# Patient Record
Sex: Male | Born: 1995 | Race: Black or African American | Hispanic: No | Marital: Single | Smoking: Never smoker
Health system: Southern US, Community
[De-identification: ages and names within clinical notes are randomized; demographics above are authoritative.]

---

## 2020-02-03 ENCOUNTER — Ambulatory Visit (HOSPITAL_COMMUNITY): Admission: EM | Admit: 2020-02-03 | Discharge: 2020-02-03 | Discharge status: Against Medical Advice | Payer: Self-pay

## 2020-02-03 ENCOUNTER — Other Ambulatory Visit: Payer: Self-pay

## 2020-07-19 ENCOUNTER — Encounter (HOSPITAL_COMMUNITY): Payer: Self-pay | Admitting: Emergency Medicine

## 2020-07-19 ENCOUNTER — Emergency Department (HOSPITAL_COMMUNITY): Payer: BC Managed Care – PPO

## 2020-07-19 ENCOUNTER — Emergency Department (HOSPITAL_COMMUNITY)
Admission: EM | Admit: 2020-07-19 | Discharge: 2020-07-20 | Disposition: A | Discharge status: Home / Self Care | Payer: BC Managed Care – PPO | Attending: Emergency Medicine | Admitting: Emergency Medicine

## 2020-07-19 ENCOUNTER — Other Ambulatory Visit: Payer: Self-pay

## 2020-07-19 DIAGNOSIS — S80912A Unspecified superficial injury of left knee, initial encounter: Secondary | ICD-10-CM | POA: Diagnosis present

## 2020-07-19 DIAGNOSIS — R52 Pain, unspecified: Secondary | ICD-10-CM

## 2020-07-19 DIAGNOSIS — S86812A Strain of other muscle(s) and tendon(s) at lower leg level, left leg, initial encounter: Secondary | ICD-10-CM | POA: Insufficient documentation

## 2020-07-19 DIAGNOSIS — Y929 Unspecified place or not applicable: Secondary | ICD-10-CM | POA: Diagnosis not present

## 2020-07-19 DIAGNOSIS — W2101XA Struck by football, initial encounter: Secondary | ICD-10-CM | POA: Diagnosis not present

## 2020-07-19 DIAGNOSIS — Y9367 Activity, basketball: Secondary | ICD-10-CM | POA: Insufficient documentation

## 2020-07-19 DIAGNOSIS — Y999 Unspecified external cause status: Secondary | ICD-10-CM | POA: Insufficient documentation

## 2020-07-19 NOTE — ED Triage Notes (Signed)
Pt c/o left knee pain and inability to bear weight after injury while playing basketball today.

## 2020-07-20 MED ORDER — HYDROCODONE-ACETAMINOPHEN 5-325 MG PO TABS
1.0000 | ORAL_TABLET | Freq: Four times a day (QID) | ORAL | 0 refills | Status: DC | PRN
Start: 2020-07-20 — End: 2020-07-31

## 2020-07-20 MED ORDER — HYDROCODONE-ACETAMINOPHEN 5-325 MG PO TABS
2.0000 | ORAL_TABLET | Freq: Once | ORAL | Status: AC
  Administered 2020-07-20: 2 via ORAL
  Filled 2020-07-20: qty 2

## 2020-07-20 MED ORDER — IBUPROFEN 400 MG PO TABS: 400.0000 mg | ORAL_TABLET | Freq: Three times a day (TID) | ORAL | 0 refills | Status: DC | PRN

## 2020-07-20 MED ORDER — IBUPROFEN 400 MG PO TABS
400.0000 mg | ORAL_TABLET | Freq: Once | ORAL | Status: AC
  Administered 2020-07-20: 400 mg via ORAL
  Filled 2020-07-20: qty 1

## 2020-07-20 NOTE — Progress Notes (Signed)
Orthopedic Tech Progress Note Patient Details:  Carlos Harris 1996/09/16 [id number]  Ortho Devices Type of Ortho Device: Arm sling Ortho Device/Splint Location: rue Ortho Device/Splint Interventions: Ordered, Application, Adjustment   Post Interventions Patient Tolerated: Well Instructions Provided: Care of device, Adjustment of device   Trinna Post 07/20/2020, 3:54 AM

## 2020-07-20 NOTE — Discharge Instructions (Addendum)
Keep your leg elevated when you are sitting. You can place ice over your kneecap to help with swelling Please call Dr. Jena Gauss on Tuesday for close follow-up. Do not bear any weight on your left leg

## 2020-07-20 NOTE — ED Provider Notes (Signed)
[facility] EMERGENCY DEPARTMENT Provider Note   CSN: [id number] Arrival date & time: 07/19/20  1758     History Chief Complaint  Patient presents with  . Knee Injury    Carlos Harris is a 24 y.o. male.  The history is provided by the patient.  Knee Pain Location:  Knee Time since incident:  11 hours Injury: yes   Knee location:  L knee Pain details:    Quality:  Aching   Radiates to:  Does not radiate   Severity:  Severe   Onset quality:  Sudden   Timing:  Constant   Progression:  Worsening Chronicity:  New Relieved by:  Nothing Worsened by:  Bearing weight, extension and flexion Associated symptoms: no back pain, no fever and no neck pain   Patient reports that he has injured his left knee. Patient was playing basketball and going up for a lay up and when he landed he felt that he injured his left knee. He does not think he hit the floor with his knee. No other injuries. No head injuries.     PMH-none Social History   Tobacco Use  . Smoking status: Not on file  Substance Use Topics  . Alcohol use: Yes  . Drug use: Not on file    Home Medications Prior to Admission medications   Medication Sig Start Date End Date Taking? Authorizing Provider  HYDROcodone-acetaminophen (NORCO/VICODIN) 5-325 MG tablet Take 1 tablet by mouth every 6 (six) hours as needed for severe pain. 07/20/20   Zadie Rhine, MD  ibuprofen (ADVIL) 400 MG tablet Take 1 tablet (400 mg total) by mouth every 8 (eight) hours as needed. 07/20/20   Zadie Rhine, MD    Allergies    Patient has no known allergies.  Review of Systems   Review of Systems  Constitutional: Negative for fever.  Musculoskeletal: Positive for arthralgias and joint swelling. Negative for back pain and neck pain.  Neurological: Negative for headaches.  All other systems reviewed and are negative.   Physical Exam Updated Vital Signs BP (!) 161/91   Pulse 96   Temp 98.4 F (36.9 C) (Oral)    Resp 18   SpO2 100%   Physical Exam CONSTITUTIONAL: Well developed/well nourished HEAD: Normocephalic/atraumatic EYES: EOMI/PERRL ENMT: Mask in place NECK: supple no meningeal signs SPINE/BACK:entire spine nontender CV: S1/S2 noted, no murmurs/rubs/gallops noted LUNGS: Lungs are clear to auscultation bilaterally, no apparent distress ABDOMEN: soft, nontender NEURO: Pt is awake/alert/appropriate, moves all extremitiesx4.  No facial droop.   EXTREMITIES: pulses normal/equal, distal pulses equal and intact Soft tissue swelling noted over the left patella. Left patella is more superior than the right patella. There is significant tenderness to the left patella. He is unable to flex the knee. There is no left thigh or left ankle tenderness All other extremities/joints palpated/ranged and nontender SKIN: warm, color normal PSYCH: no abnormalities of mood noted, alert and oriented to situation  ED Results / Procedures / Treatments   Labs (all labs ordered are listed, but only abnormal results are displayed) Labs Reviewed - No data to display  EKG None  Radiology DG Knee 1-2 Views Left  Result Date: 07/19/2020 CLINICAL DATA:  Injury today.  Pain. EXAM: LEFT KNEE - 1-2 VIEW COMPARISON:  None. FINDINGS: No acute fracture or dislocation. the patella is high riding, suggesting patellar tendon disruption. No knee joint effusion. IMPRESSION: High riding patella suggests patellar tendon disruption. This could alternatively be positional as the technologist states the patient  is unable to be placed in the in the routine position. Electronically Signed   By: Jeronimo Greaves M.D.   On: 07/19/2020 18:39    Procedures .Ortho Injury Treatment  Date/Time: 07/20/2020 3:25 AM Performed by: Zadie Rhine, MD Authorized by: Zadie Rhine, MD   Consent:    Consent obtained:  Verbal   Consent given by:  Patient   Alternatives discussed:  No treatmentInjury location: knee Location details: left  knee Injury type: soft tissue Pre-procedure neurovascular assessment: neurovascularly intact Pre-procedure distal perfusion: normal Pre-procedure neurological function: normal Immobilization: crutches (Knee immobilizer) Post-procedure neurovascular assessment: post-procedure neurovascularly intact Post-procedure distal perfusion: normal Post-procedure neurological function: normal Patient tolerance: patient tolerated the procedure well with no immediate complications     Medications Ordered in ED Medications  ibuprofen (ADVIL) tablet 400 mg (400 mg Oral Given 07/20/20 0248)  HYDROcodone-acetaminophen (NORCO/VICODIN) 5-325 MG per tablet 2 tablet (2 tablets Oral Given 07/20/20 0247)    ED Course  I have reviewed the triage vital signs and the nursing notes.  Pertinent imaging results that were available during my care of the patient were reviewed by me and considered in my medical decision making (see chart for details).    MDM Rules/Calculators/A&P                          Patient presents after injuring his left knee while playing basketball. Patient has obvious left patellar tendon rupture. There is no bony fracture Patient has been placed in a knee immobilizer with crutches. He will call orthopedics tomorrow. He may require surgical repair.  Final Clinical Impression(s) / ED Diagnoses Final diagnoses:  Patellar tendon rupture, left, initial encounter    Rx / DC Orders ED Discharge Orders         Ordered    HYDROcodone-acetaminophen (NORCO/VICODIN) 5-325 MG tablet  Every 6 hours PRN        07/20/20 0329    ibuprofen (ADVIL) 400 MG tablet  Every 8 hours PRN        07/20/20 0329           Zadie Rhine, MD 07/20/20 [phone]

## 2020-07-20 NOTE — ED Notes (Signed)
Ortho has been paged and will arrive soon for orders

## 2020-07-28 ENCOUNTER — Other Ambulatory Visit (HOSPITAL_COMMUNITY)
Admission: RE | Admit: 2020-07-28 | Discharge: 2020-07-28 | Disposition: A | Discharge status: Home / Self Care | Payer: BC Managed Care – PPO | Source: Ambulatory Visit | Attending: Orthopaedic Surgery | Admitting: Orthopaedic Surgery

## 2020-07-28 DIAGNOSIS — Z20822 Contact with and (suspected) exposure to covid-19: Secondary | ICD-10-CM | POA: Diagnosis not present

## 2020-07-28 DIAGNOSIS — Z01812 Encounter for preprocedural laboratory examination: Secondary | ICD-10-CM | POA: Insufficient documentation

## 2020-07-28 LAB — SARS CORONAVIRUS 2 (TAT 6-24 HRS): SARS Coronavirus 2: NEGATIVE

## 2020-07-29 ENCOUNTER — Encounter (HOSPITAL_BASED_OUTPATIENT_CLINIC_OR_DEPARTMENT_OTHER): Payer: Self-pay | Admitting: Orthopaedic Surgery

## 2020-07-29 ENCOUNTER — Other Ambulatory Visit: Payer: Self-pay

## 2020-07-30 NOTE — H&P (Signed)
PREOPERATIVE H&P  Chief Complaint: SPONTANEOUS RUPTURE OF ETENSOR TENDON LEFT LOWER LEG  HPI: Carlos Harris is a 24 y.o. male who is scheduled for LEFT PATELLA TENDON REPAIR.   Patient is a healthy 24 year old who was playing basketball and going up for a lay up and when he landed he felt that he injured his left knee. He went to [facility] Emergency Department on 07/19/2020. He had an obvious patellar tendon rupture. He was placed in knee immobilizer and told to follow-up with orthopedics for surgical discussion.   His symptoms are rated as moderate to severe, and have been worsening.  This is significantly impairing activities of daily living.    Please see clinic note for further details on this patient's care.    He has elected for surgical management.   History reviewed. No pertinent past medical history. History reviewed. No pertinent surgical history. Social History   Socioeconomic History  . Marital status: Single    Spouse name: Not on file  . Number of children: Not on file  . Years of education: Not on file  . Highest education level: Not on file  Occupational History  . Not on file  Tobacco Use  . Smoking status: Never Smoker  . Smokeless tobacco: Never Used  Substance and Sexual Activity  . Alcohol use: Yes    Comment: once a month  . Drug use: Never  . Sexual activity: Not on file  Other Topics Concern  . Not on file  Social History Narrative  . Not on file   Social Determinants of Health   Financial Resource Strain:   . Difficulty of Paying Living Expenses: Not on file  Food Insecurity:   . Worried About Programme researcher, broadcasting/film/video in the Last Year: Not on file  . Ran Out of Food in the Last Year: Not on file  Transportation Needs:   . Lack of Transportation (Medical): Not on file  . Lack of Transportation (Non-Medical): Not on file  Physical Activity:   . Days of Exercise per Week: Not on file  . Minutes of Exercise per Session: Not on file  Stress:    . Feeling of Stress : Not on file  Social Connections:   . Frequency of Communication with Friends and Family: Not on file  . Frequency of Social Gatherings with Friends and Family: Not on file  . Attends Religious Services: Not on file  . Active Member of Clubs or Organizations: Not on file  . Attends Banker Meetings: Not on file  . Marital Status: Not on file   History reviewed. No pertinent family history. No Known Allergies Prior to Admission medications   Medication Sig Start Date End Date Taking? Authorizing Provider  HYDROcodone-acetaminophen (NORCO/VICODIN) 5-325 MG tablet Take 1 tablet by mouth every 6 (six) hours as needed for severe pain. 07/20/20   Zadie Rhine, MD  ibuprofen (ADVIL) 400 MG tablet Take 1 tablet (400 mg total) by mouth every 8 (eight) hours as needed. 07/20/20   Zadie Rhine, MD    ROS: All other systems have been reviewed and were otherwise negative with the exception of those mentioned in the HPI and as above.  Physical Exam: General: Alert, no acute distress Cardiovascular: No pedal edema Respiratory: No cyanosis, no use of accessory musculature GI: No organomegaly, abdomen is soft and non-tender Skin: No lesions in the area of chief complaint Neurologic: Sensation intact distally Psychiatric: Patient is competent for consent with normal  mood and affect Lymphatic: No axillary or cervical lymphadenopathy  MUSCULOSKELETAL:  Left knee: high patella compared to contralateral side. Tender to palpation left patella. Palpable deformity. Effusion. Limited ROM due to pain.   Imaging: MRI of left knee demonstrates complete tear of the mid patellar tendon and intact ACL.   Assessment: SPONTANEOUS RUPTURE OF ETENSOR TENDON LEFT LOWER LEG  Plan: Plan for Procedure(s): LEFT PATELLA TENDON REPAIR  The risks benefits and alternatives were discussed with the patient including but not limited to the risks of nonoperative treatment, versus  surgical intervention including infection, bleeding, nerve injury,  blood clots, cardiopulmonary complications, morbidity, mortality, among others, and they were willing to proceed.   The patient acknowledged the explanation, agreed to proceed with the plan and consent was signed.   Operative Plan: Left patella tendon repair Discharge Medications: Standard DVT Prophylaxis: Aspirin Physical Therapy: Outpatient PT Special Discharge needs: Knee immobilizer   Vernetta Honey, PA-C  07/30/2020 2:54 PM

## 2020-07-31 ENCOUNTER — Other Ambulatory Visit: Payer: Self-pay

## 2020-07-31 ENCOUNTER — Encounter (HOSPITAL_BASED_OUTPATIENT_CLINIC_OR_DEPARTMENT_OTHER)
Admission: RE | Disposition: A | Discharge status: Home / Self Care | Payer: Self-pay | Source: Home / Self Care | Attending: Orthopaedic Surgery

## 2020-07-31 ENCOUNTER — Ambulatory Visit (HOSPITAL_BASED_OUTPATIENT_CLINIC_OR_DEPARTMENT_OTHER): Payer: BC Managed Care – PPO | Admitting: Certified Registered"

## 2020-07-31 ENCOUNTER — Ambulatory Visit (HOSPITAL_BASED_OUTPATIENT_CLINIC_OR_DEPARTMENT_OTHER)
Admission: RE | Admit: 2020-07-31 | Discharge: 2020-07-31 | Disposition: A | Discharge status: Home / Self Care | Payer: BC Managed Care – PPO | Attending: Orthopaedic Surgery | Admitting: Orthopaedic Surgery

## 2020-07-31 ENCOUNTER — Encounter (HOSPITAL_BASED_OUTPATIENT_CLINIC_OR_DEPARTMENT_OTHER): Payer: Self-pay | Admitting: Orthopaedic Surgery

## 2020-07-31 DIAGNOSIS — S76112A Strain of left quadriceps muscle, fascia and tendon, initial encounter: Secondary | ICD-10-CM | POA: Diagnosis not present

## 2020-07-31 DIAGNOSIS — X500XXA Overexertion from strenuous movement or load, initial encounter: Secondary | ICD-10-CM | POA: Insufficient documentation

## 2020-07-31 HISTORY — PX: PATELLAR TENDON REPAIR: SHX737

## 2020-07-31 SURGERY — REPAIR, TENDON, PATELLAR
Anesthesia: Regional | Site: Knee | Laterality: Left

## 2020-07-31 MED ORDER — VANCOMYCIN HCL 1000 MG IV SOLR
INTRAVENOUS | Status: AC
  Filled 2020-07-31: qty 1000

## 2020-07-31 MED ORDER — ASPIRIN 81 MG PO CHEW: 81.0000 mg | CHEWABLE_TABLET | Freq: Two times a day (BID) | ORAL | 0 refills | Status: AC

## 2020-07-31 MED ORDER — LIDOCAINE 2% (20 MG/ML) 5 ML SYRINGE
INTRAMUSCULAR | Status: AC
  Filled 2020-07-31: qty 5

## 2020-07-31 MED ORDER — ACETAMINOPHEN 160 MG/5ML PO SOLN: 325.0000 mg | ORAL | Status: DC | PRN

## 2020-07-31 MED ORDER — GLYCOPYRROLATE PF 0.2 MG/ML IJ SOSY
PREFILLED_SYRINGE | INTRAMUSCULAR | Status: AC
  Filled 2020-07-31: qty 2

## 2020-07-31 MED ORDER — OXYCODONE HCL 5 MG PO TABS: ORAL_TABLET | ORAL | 0 refills | Status: AC

## 2020-07-31 MED ORDER — OXYCODONE HCL 5 MG PO TABS
ORAL_TABLET | ORAL | Status: AC
  Filled 2020-07-31: qty 1

## 2020-07-31 MED ORDER — DEXAMETHASONE SODIUM PHOSPHATE 10 MG/ML IJ SOLN
INTRAMUSCULAR | Status: AC
  Filled 2020-07-31: qty 1

## 2020-07-31 MED ORDER — VANCOMYCIN HCL 1 G IV SOLR
INTRAVENOUS | Status: DC | PRN
  Administered 2020-07-31: 1000 mg

## 2020-07-31 MED ORDER — ROCURONIUM BROMIDE 10 MG/ML (PF) SYRINGE
PREFILLED_SYRINGE | INTRAVENOUS | Status: AC
  Filled 2020-07-31: qty 20

## 2020-07-31 MED ORDER — CEFAZOLIN SODIUM-DEXTROSE 2-4 GM/100ML-% IV SOLN: 2.0000 g | INTRAVENOUS | Status: DC

## 2020-07-31 MED ORDER — PROPOFOL 10 MG/ML IV BOLUS
INTRAVENOUS | Status: DC | PRN
  Administered 2020-07-31: 180 mg via INTRAVENOUS

## 2020-07-31 MED ORDER — MIDAZOLAM HCL 2 MG/2ML IJ SOLN
INTRAMUSCULAR | Status: AC
  Filled 2020-07-31: qty 2

## 2020-07-31 MED ORDER — FENTANYL CITRATE (PF) 100 MCG/2ML IJ SOLN
INTRAMUSCULAR | Status: AC
  Filled 2020-07-31: qty 2

## 2020-07-31 MED ORDER — DEXAMETHASONE SODIUM PHOSPHATE 10 MG/ML IJ SOLN
INTRAMUSCULAR | Status: DC | PRN
  Administered 2020-07-31: 10 mg via INTRAVENOUS

## 2020-07-31 MED ORDER — CEFAZOLIN SODIUM-DEXTROSE 2-3 GM-%(50ML) IV SOLR
INTRAVENOUS | Status: DC | PRN
  Administered 2020-07-31: 2 g via INTRAVENOUS

## 2020-07-31 MED ORDER — LACTATED RINGERS IV SOLN: INTRAVENOUS | Status: DC

## 2020-07-31 MED ORDER — FENTANYL CITRATE (PF) 100 MCG/2ML IJ SOLN
INTRAMUSCULAR | Status: DC | PRN
Start: 2020-07-31 — End: 2020-07-31
  Administered 2020-07-31: 50 ug via INTRAVENOUS
  Administered 2020-07-31 (×2): 25 ug via INTRAVENOUS

## 2020-07-31 MED ORDER — ONDANSETRON HCL 4 MG/2ML IJ SOLN
INTRAMUSCULAR | Status: DC | PRN
  Administered 2020-07-31: 4 mg via INTRAVENOUS

## 2020-07-31 MED ORDER — FENTANYL CITRATE (PF) 100 MCG/2ML IJ SOLN
50.0000 ug | Freq: Once | INTRAMUSCULAR | Status: AC
  Administered 2020-07-31: 50 ug via INTRAVENOUS

## 2020-07-31 MED ORDER — LACTATED RINGERS IV SOLN: INTRAVENOUS | Status: DC | PRN

## 2020-07-31 MED ORDER — HYDROMORPHONE HCL 1 MG/ML IJ SOLN
INTRAMUSCULAR | Status: AC
  Filled 2020-07-31: qty 0.5

## 2020-07-31 MED ORDER — ONDANSETRON HCL 4 MG PO TABS: 4.0000 mg | ORAL_TABLET | Freq: Three times a day (TID) | ORAL | 1 refills | Status: AC | PRN

## 2020-07-31 MED ORDER — CEFAZOLIN SODIUM-DEXTROSE 2-4 GM/100ML-% IV SOLN
INTRAVENOUS | Status: AC
  Filled 2020-07-31: qty 100

## 2020-07-31 MED ORDER — ACETAMINOPHEN 500 MG PO TABS: 1000.0000 mg | ORAL_TABLET | Freq: Three times a day (TID) | ORAL | 0 refills | Status: AC

## 2020-07-31 MED ORDER — ACETAMINOPHEN 325 MG PO TABS: 325.0000 mg | ORAL_TABLET | ORAL | Status: DC | PRN

## 2020-07-31 MED ORDER — BUPIVACAINE HCL (PF) 0.5 % IJ SOLN
INTRAMUSCULAR | Status: DC | PRN
  Administered 2020-07-31: 30 mL via PERINEURAL

## 2020-07-31 MED ORDER — ONDANSETRON HCL 4 MG/2ML IJ SOLN
INTRAMUSCULAR | Status: AC
  Filled 2020-07-31: qty 2

## 2020-07-31 MED ORDER — ONDANSETRON HCL 4 MG/2ML IJ SOLN: 4.0000 mg | Freq: Once | INTRAMUSCULAR | Status: DC | PRN

## 2020-07-31 MED ORDER — PROPOFOL 10 MG/ML IV BOLUS
INTRAVENOUS | Status: AC
  Filled 2020-07-31: qty 20

## 2020-07-31 MED ORDER — MIDAZOLAM HCL 2 MG/2ML IJ SOLN
2.0000 mg | Freq: Once | INTRAMUSCULAR | Status: AC
  Administered 2020-07-31: 2 mg via INTRAVENOUS

## 2020-07-31 MED ORDER — BUPIVACAINE HCL (PF) 0.5 % IJ SOLN
INTRAMUSCULAR | Status: AC
  Filled 2020-07-31: qty 30

## 2020-07-31 MED ORDER — OXYCODONE HCL 5 MG PO TABS
5.0000 mg | ORAL_TABLET | Freq: Once | ORAL | Status: AC | PRN
  Administered 2020-07-31: 5 mg via ORAL

## 2020-07-31 MED ORDER — MELOXICAM 7.5 MG PO TABS: 7.5000 mg | ORAL_TABLET | Freq: Every day | ORAL | 0 refills | Status: AC

## 2020-07-31 MED ORDER — OXYCODONE HCL 5 MG/5ML PO SOLN: 5.0000 mg | Freq: Once | ORAL | Status: AC | PRN

## 2020-07-31 MED ORDER — HYDROMORPHONE HCL 1 MG/ML IJ SOLN
0.2500 mg | INTRAMUSCULAR | Status: DC | PRN
  Administered 2020-07-31 (×2): 0.5 mg via INTRAVENOUS

## 2020-07-31 MED ORDER — MIDAZOLAM HCL 2 MG/2ML IJ SOLN
INTRAMUSCULAR | Status: DC | PRN
  Administered 2020-07-31: 2 mg via INTRAVENOUS

## 2020-07-31 SURGICAL SUPPLY — 86 items
ANCH SUT SWLK 19.1X4.75 (Anchor) ×4 IMPLANT
ANCHOR SUT BIO SW 4.75X19.1 (Anchor) ×12 IMPLANT
APL PRP STRL LF DISP 70% ISPRP (MISCELLANEOUS) ×1
APL SKNCLS STERI-STRIP NONHPOA (GAUZE/BANDAGES/DRESSINGS) ×1
BANDAGE ESMARK 6X9 LF (GAUZE/BANDAGES/DRESSINGS) ×1 IMPLANT
BENZOIN TINCTURE PRP APPL 2/3 (GAUZE/BANDAGES/DRESSINGS) ×3 IMPLANT
BLADE SURG 10 STRL SS (BLADE) ×3 IMPLANT
BLADE SURG 15 STRL LF DISP TIS (BLADE) ×1 IMPLANT
BLADE SURG 15 STRL SS (BLADE) ×3
BNDG CMPR 9X6 STRL LF SNTH (GAUZE/BANDAGES/DRESSINGS) ×1
BNDG COHESIVE 4X5 TAN STRL (GAUZE/BANDAGES/DRESSINGS) ×3 IMPLANT
BNDG ELASTIC 4X5.8 VLCR STR LF (GAUZE/BANDAGES/DRESSINGS) ×3 IMPLANT
BNDG ELASTIC 6X5.8 VLCR STR LF (GAUZE/BANDAGES/DRESSINGS) ×3 IMPLANT
BNDG ESMARK 6X9 LF (GAUZE/BANDAGES/DRESSINGS) ×3
CHLORAPREP W/TINT 26 (MISCELLANEOUS) ×3 IMPLANT
CLOSURE STERI-STRIP 1/2X4 (GAUZE/BANDAGES/DRESSINGS) ×1
CLSR STERI-STRIP ANTIMIC 1/2X4 (GAUZE/BANDAGES/DRESSINGS) ×2 IMPLANT
COVER BACK TABLE 60X90IN (DRAPES) IMPLANT
COVER MAYO STAND STRL (DRAPES) ×3 IMPLANT
COVER WAND RF STERILE (DRAPES) IMPLANT
CUFF TOURNIQUET SINGLE 34IN LL (TOURNIQUET CUFF) ×3 IMPLANT
DECANTER SPIKE VIAL GLASS SM (MISCELLANEOUS) IMPLANT
DRAPE C-ARM 42X72 X-RAY (DRAPES) IMPLANT
DRAPE C-ARMOR (DRAPES) IMPLANT
DRAPE EXTREMITY T 121X128X90 (DISPOSABLE) ×3 IMPLANT
DRAPE IMP U-DRAPE 54X76 (DRAPES) ×6 IMPLANT
DRAPE INCISE IOBAN 66X45 STRL (DRAPES) ×3 IMPLANT
DRAPE U-SHAPE 47X51 STRL (DRAPES) ×3 IMPLANT
DRSG AQUACEL AG ADV 3.5X10 (GAUZE/BANDAGES/DRESSINGS) ×3 IMPLANT
ELECT REM PT RETURN 9FT ADLT (ELECTROSURGICAL) ×3
ELECTRODE REM PT RTRN 9FT ADLT (ELECTROSURGICAL) ×1 IMPLANT
GAUZE SPONGE 4X4 12PLY STRL (GAUZE/BANDAGES/DRESSINGS) ×3 IMPLANT
GAUZE XEROFORM 1X8 LF (GAUZE/BANDAGES/DRESSINGS) ×3 IMPLANT
GLOVE BIO SURGEON STRL SZ 6.5 (GLOVE) ×2 IMPLANT
GLOVE BIO SURGEONS STRL SZ 6.5 (GLOVE) ×1
GLOVE BIOGEL PI IND STRL 6.5 (GLOVE) ×1 IMPLANT
GLOVE BIOGEL PI IND STRL 8 (GLOVE) ×1 IMPLANT
GLOVE BIOGEL PI INDICATOR 6.5 (GLOVE) ×2
GLOVE BIOGEL PI INDICATOR 8 (GLOVE) ×2
GLOVE ECLIPSE 8.0 STRL XLNG CF (GLOVE) ×3 IMPLANT
GOWN STRL REUS W/ TWL LRG LVL3 (GOWN DISPOSABLE) ×1 IMPLANT
GOWN STRL REUS W/TWL LRG LVL3 (GOWN DISPOSABLE) ×3
GOWN STRL REUS W/TWL XL LVL3 (GOWN DISPOSABLE) ×3 IMPLANT
IMMOBILIZER KNEE 22 UNIV (SOFTGOODS) IMPLANT
IMMOBILIZER KNEE 24 THIGH 36 (MISCELLANEOUS) IMPLANT
IMMOBILIZER KNEE 24 UNIV (MISCELLANEOUS)
IMPL SYS BIOCOMP ACH SPEED (Anchor) ×1 IMPLANT
IMPLANT SYS BIOCOMP ACH SPEED (Anchor) ×3 IMPLANT
KIT ASCP FXDISP 3X8XBTNDS (KITS) IMPLANT
KIT BIO-TENODESIS 3X8 DISP (KITS)
NDL SUT 6 .5 CRC .975X.05 MAYO (NEEDLE) IMPLANT
NEEDLE MAYO TAPER (NEEDLE)
NEEDLE MAYO TROCAR (NEEDLE) ×3 IMPLANT
NS IRRIG 1000ML POUR BTL (IV SOLUTION) ×3 IMPLANT
PACK ARTHROSCOPY DSU (CUSTOM PROCEDURE TRAY) ×3 IMPLANT
PACK BASIN DAY SURGERY FS (CUSTOM PROCEDURE TRAY) ×3 IMPLANT
PAD CAST 4YDX4 CTTN HI CHSV (CAST SUPPLIES) ×1 IMPLANT
PADDING CAST ABS 6INX4YD NS (CAST SUPPLIES)
PADDING CAST ABS COTTON 6X4 NS (CAST SUPPLIES) IMPLANT
PADDING CAST COTTON 4X4 STRL (CAST SUPPLIES) ×3
PENCIL SMOKE EVACUATOR (MISCELLANEOUS) ×3 IMPLANT
RETRIEVER SUT HEWSON (MISCELLANEOUS) ×3 IMPLANT
SLEEVE SCD COMPRESS KNEE MED (MISCELLANEOUS) IMPLANT
SPONGE LAP 18X18 RF (DISPOSABLE) ×3 IMPLANT
STAPLER VISISTAT 35W (STAPLE) IMPLANT
SUCTION FRAZIER HANDLE 10FR (MISCELLANEOUS) ×2
SUCTION TUBE FRAZIER 10FR DISP (MISCELLANEOUS) ×1 IMPLANT
SUT FIBERWIRE #2 38 REV NDL BL (SUTURE) ×3
SUT FIBERWIRE #5 38 CONV NDL (SUTURE) ×12
SUT MNCRL AB 4-0 PS2 18 (SUTURE) ×3 IMPLANT
SUT VIC AB 0 CT1 18XCR BRD 8 (SUTURE) ×1 IMPLANT
SUT VIC AB 0 CT1 27 (SUTURE) ×6
SUT VIC AB 0 CT1 27XBRD ANBCTR (SUTURE) ×2 IMPLANT
SUT VIC AB 0 CT1 8-18 (SUTURE) ×3
SUT VIC AB 2-0 CT1 27 (SUTURE) ×3
SUT VIC AB 2-0 CT1 TAPERPNT 27 (SUTURE) ×1 IMPLANT
SUT VIC AB 3-0 SH 27 (SUTURE) ×3
SUT VIC AB 3-0 SH 27X BRD (SUTURE) ×1 IMPLANT
SUT VIC AB PLUS 45CM 1-MO-4 (SUTURE) ×6 IMPLANT
SUTURE FIBERWR #5 38 CONV NDL (SUTURE) ×4 IMPLANT
SUTURE FIBERWR#2 38 REV NDL BL (SUTURE) ×1 IMPLANT
SUTURE TAPE 1.3 FIBERLOP 20 ST (SUTURE) IMPLANT
SUTURETAPE 1.3 FIBERLOOP 20 ST (SUTURE)
SYR BULB EAR ULCER 3OZ GRN STR (SYRINGE) ×3 IMPLANT
TOWEL GREEN STERILE FF (TOWEL DISPOSABLE) ×3 IMPLANT
TUBE SUCTION HIGH CAP CLEAR NV (SUCTIONS) ×3 IMPLANT

## 2020-07-31 NOTE — Progress Notes (Signed)
Assisted Dr. Nance Pew with left, ultrasound guided, adductor canal block. Side rails up, monitors on throughout procedure. See vital signs in flow sheet. Tolerated Procedure well.

## 2020-07-31 NOTE — Interval H&P Note (Signed)
History and Physical Interval Note:  07/31/2020 11:55 AM  Carlos Harris  has presented today for surgery, with the diagnosis of SPONTANEOUS RUPTURE OF ETENSOR TENDON LEFT LOWER LEG.  The various methods of treatment have been discussed with the patient and family. After consideration of risks, benefits and other options for treatment, the patient has consented to  Procedure(s): LEFT PATELLA TENDON REPAIR (Left) as a surgical intervention.  The patient's history has been reviewed, patient examined, no change in status, stable for surgery.  I have reviewed the patient's chart and labs.  Questions were answered to the patient's satisfaction.     Bjorn Pippin

## 2020-07-31 NOTE — Anesthesia Procedure Notes (Signed)
Anesthesia Regional Block: Adductor canal block   Pre-Anesthetic Checklist: ,, timeout performed, Correct Patient, Correct Site, Correct Laterality, Correct Procedure, Correct Position, site marked, Risks and benefits discussed,  Surgical consent,  Pre-op evaluation,  At surgeon's request and post-op pain management  Laterality: Left  Prep: Dura Prep       Needles:  Injection technique: Single-shot  Needle Type: Echogenic Stimulator Needle     Needle Length: 4cm  Needle Gauge: 20     Additional Needles:   Procedures:,,,, ultrasound used (permanent image in chart),,,,  Narrative:  Start time: 07/31/2020 11:00 AM End time: 07/31/2020 11:07 AM  Performed by: Personally  Anesthesiologist: Dhani Dannemiller, Nelle Don, DO  Additional Notes: Patient identified. Risks/Benefits/Options discussed with patient including but not limited to bleeding, infection, nerve damage, failed block, incomplete pain control. Patient expressed understanding and wished to proceed. All questions were answered. Sterile technique was used throughout the entire procedure. Please see nursing notes for vital signs. Aspirated in 5cc intervals with injection for negative confirmation. Patient was given instructions on fall risk and not to get out of bed. All questions and concerns addressed with instructions to call with any issues or inadequate analgesia.

## 2020-07-31 NOTE — Anesthesia Procedure Notes (Signed)
Procedure Name: LMA Insertion Performed by: Cillian Gwinner M, CRNA Pre-anesthesia Checklist: Patient identified, Emergency Drugs available, Suction available and Patient being monitored Patient Re-evaluated:Patient Re-evaluated prior to induction Oxygen Delivery Method: Circle system utilized Preoxygenation: Pre-oxygenation with 100% oxygen Induction Type: IV induction Ventilation: Mask ventilation without difficulty LMA: LMA inserted LMA Size: 4.0 Number of attempts: 1 Airway Equipment and Method: Bite block Placement Confirmation: positive ETCO2 Tube secured with: Tape Dental Injury: Teeth and Oropharynx as per pre-operative assessment        

## 2020-07-31 NOTE — Discharge Instructions (Signed)
Oxycodone given at 3:30pm. Next dose of Oxycodone can be given at 9:30pm if needed.    Post Anesthesia Home Care Instructions  Activity: Get plenty of rest for the remainder of the day. A responsible individual must stay with you for 24 hours following the procedure.  For the next 24 hours, DO NOT: -Drive a car -Advertising copywriter -Drink alcoholic beverages -Take any medication unless instructed by your physician -Make any legal decisions or sign important papers.  Meals: Start with liquid foods such as gelatin or soup. Progress to regular foods as tolerated. Avoid greasy, spicy, heavy foods. If nausea and/or vomiting occur, drink only clear liquids until the nausea and/or vomiting subsides. Call your physician if vomiting continues.  Special Instructions/Symptoms: Your throat may feel dry or sore from the anesthesia or the breathing tube placed in your throat during surgery. If this causes discomfort, gargle with warm salt water. The discomfort should disappear within 24 hours.      Regional Anesthesia Blocks  1. Numbness or the inability to move the "blocked" extremity may last from 3-48 hours after placement. The length of time depends on the medication injected and your individual response to the medication. If the numbness is not going away after 48 hours, call your surgeon.  2. The extremity that is blocked will need to be protected until the numbness is gone and the  Strength has returned. Because you cannot feel it, you will need to take extra care to avoid injury. Because it may be weak, you may have difficulty moving it or using it. You may not know what position it is in without looking at it while the block is in effect.  3. For blocks in the legs and feet, returning to weight bearing and walking needs to be done carefully. You will need to wait until the numbness is entirely gone and the strength has returned. You should be able to move your leg and foot normally before you  try and bear weight or walk. You will need someone to be with you when you first try to ensure you do not fall and possibly risk injury.  4. Bruising and tenderness at the needle site are common side effects and will resolve in a few days.  5. Persistent numbness or new problems with movement should be communicated to the surgeon or the [facility] [phone] [facility] [phone]).

## 2020-07-31 NOTE — Anesthesia Preprocedure Evaluation (Addendum)
Anesthesia Evaluation  Patient identified by MRN, date of birth, ID band  Reviewed: Patient's Chart, lab work & pertinent test results  Airway Mallampati: I  TM Distance: >3 FB Neck ROM: Full    Dental  (+) Teeth Intact   Pulmonary neg pulmonary ROS,    Pulmonary exam normal        Cardiovascular negative cardio ROS   Rhythm:Regular Rate:Normal     Neuro/Psych    GI/Hepatic negative GI ROS, Neg liver ROS,   Endo/Other  negative endocrine ROS  Renal/GU negative Renal ROS  negative genitourinary   Musculoskeletal Left patella tendon repair playing basketball   Abdominal Normal abdominal exam  (+)  Abdomen: soft. Bowel sounds: normal.  Peds  Hematology negative hematology ROS (+)   Anesthesia Other Findings   Reproductive/Obstetrics                            Anesthesia Physical Anesthesia Plan  ASA: II  Anesthesia Plan: General and Regional   Post-op Pain Management:  Regional for Post-op pain   Induction: Intravenous  PONV Risk Score and Plan: 2 and Ondansetron and Dexamethasone  Airway Management Planned: Mask and LMA  Additional Equipment: None  Intra-op Plan:   Post-operative Plan: Extubation in OR  Informed Consent: I have reviewed the patients History and Physical, chart, labs and discussed the procedure including the risks, benefits and alternatives for the proposed anesthesia with the patient or authorized representative who has indicated his/her understanding and acceptance.       Plan Discussed with: CRNA and Surgeon  Anesthesia Plan Comments: (Covid-19 Nucleic Acid Test Results Lab Results      Component                Value               Date                      [id number]              NEGATIVE            07/28/2020          )        Anesthesia Quick Evaluation

## 2020-07-31 NOTE — Transfer of Care (Signed)
Immediate Anesthesia Transfer of Care Note  Patient: Carlos Harris  Procedure(s) Performed: LEFT PATELLA TENDON REPAIR (Left Knee)  Patient Location: PACU  Anesthesia Type:General and Regional  Level of Consciousness: awake, alert  and oriented  Airway & Oxygen Therapy: Patient Spontanous Breathing and Patient connected to face mask oxygen  Post-op Assessment: Report given to RN and Post -op Vital signs reviewed and stable  Post vital signs: Reviewed and stable  Last Vitals:  Vitals Value Taken Time  BP    Temp    Pulse    Resp    SpO2      Last Pain:  Vitals:   07/31/20 1046  TempSrc: Oral  PainSc: 0-No pain         Complications: No complications documented.

## 2020-07-31 NOTE — Anesthesia Postprocedure Evaluation (Signed)
Anesthesia Post Note  Patient: Carlos Harris  Procedure(s) Performed: LEFT PATELLA TENDON REPAIR (Left Knee)     Patient location during evaluation: PACU Anesthesia Type: Regional and General Level of consciousness: sedated and patient cooperative Pain management: pain level controlled Vital Signs Assessment: post-procedure vital signs reviewed and stable Respiratory status: spontaneous breathing Cardiovascular status: stable Anesthetic complications: no   No complications documented.  Last Vitals:  Vitals:   07/31/20 1500 07/31/20 1503  BP: (!) 155/96 (!) 148/90  Pulse: 78 71  Resp: 13 12  Temp:    SpO2: 98% 100%    Last Pain:  Vitals:   07/31/20 1445  TempSrc:   PainSc: Asleep                 Lewie Loron

## 2020-07-31 NOTE — Op Note (Signed)
Orthopaedic Surgery Operative Note (CSN: [id number])  Carlos Harris  August 02, 1996 Date of Surgery: 07/31/2020   Diagnoses:  Left knee patellar tendon rupture mid substance  Procedure: Left patellar tendon repair with internal bracing Left medial retinacular closure and repair Left lateral retinacular closure and repair   Operative Finding Successful completion of the planned procedure.  Patient's tear was atypical rupturing leaving two thirds of the tendon on the patella and only a third still attached to the tubercle.  We were worried about an end-to-end repair based on this.  We performed a appropriate repair take care not to over tension and placed a internal brace type configuration using 3 distal swivel locks into proximal in the patella.  We had good range of motion to 45 degrees without issue.  If he ruptures or fails this repair he would need a allograft reconstruction most likely with a hamstring augmentation.  Post-operative plan: The patient will be weightbearing to tolerance with the brace locked in extension with therapy to start 2 weeks after surgery.  The patient will be discharged home.  DVT prophylaxis Aspirin 81 mg twice daily for 6 weeks.   Pain control with PRN pain medication preferring oral medicines.  Follow up plan will be scheduled in approximately 7 days for incision check and XR.  Post-Op Diagnosis: Same Surgeons:Primary: Bjorn Pippin, MD Assistants:Caroline McBane PA-C Location: [facility] OR ROOM 1 Anesthesia: General with regional anesthesia Antibiotics: Ancef 2 g with local vancomycin powder 1 g at the surgical site Tourniquet time:  Total Tourniquet Time Documented: Thigh (Left) - 110 minutes Total: Thigh (Left) - 110 minutes  Estimated Blood Loss: Minimal Complications: None Specimens: None Implants: Implant Name Type Inv. Item Serial No. Manufacturer Lot No. LRB No. Used Action  IMPLANT SYS BIOCOMP [facility] SPEED - [id number] Anchor IMPLANT SYS BIOCOMP [employer]  SPEED  [employer] [id number] Left 1 Implanted  ANCHOR SUT BIO SW 4.75X19.1 - [id number] Anchor ANCHOR SUT BIO SW 4.75X19.1  [employer] [id number] Left 1 Implanted  ANCHOR SUT BIO SW 4.75X19.1 - [id number] Anchor ANCHOR SUT BIO SW 4.75X19.1  Marcie Bal [id number] Left 1 Implanted    Indications for Surgery:   Carlos Harris is a 24 y.o. male with .  Benefits and risks of operative and nonoperative management were discussed prior to surgery with patient/guardian(s) and informed consent form was completed.  Specific risks including infection, need for additional surgery, rerupture, stiffness, need for further surgery amongst others   Procedure:   The patient was identified properly. Informed consent was obtained and the surgical site was marked. The patient was taken up to suite where general anesthesia was induced.  The patient was positioned supine on a regular bed.  The left knee was prepped and draped in the usual sterile fashion.  Timeout was performed before the beginning of the case.  Tourniquet was used for the above duration.  The mid longitudinal approach to the anterior knee went to skin sharp achieving hemostasis progressed.  We raised full-thickness skin flaps and medial lateral planes.  We identified the retinacular tears medial and laterally.  We irrigated copiously clearing seroma.  That point we identified that the tendon was torn mid substance with a third left on the tibia and two thirds on the patella.  We placed locking Krakw #5 stitches both proximally and distally keeping track of the sutures.  At that point we placed 2 bio composite 4.75 mm swivel locks in the patella and passed fiber tapes up through the tendon.  We then were able to perform a end-to-end type repair with our distal sutures into our proximal tendon.  We were then able to get good tension and length relationship.  Once this was complete we were able to take our proximal placed repair sutures as well as her fiber  tape internal brace is in place him into a series of 3 distal swivel locks achieving an overlay.  This also included an internal brace with multiple fiber tapes.  Were careful not to over tension.  We had some difficulty placing her distal swivel locks with the patient's bone was significantly dense but we were able to get good fixation with each after reaming with a 5 mm reamer and tapping.  Once this was placed in the were stable and range of motion 45 degrees.  We irrigated again and placed interrupted #1 Vicryl sutures to close and repair the lateral and medial retinaculum.  2 separate parts the procedure.  Spent extensive amount of time closing this area.  We over stitched the repair with Vicryl as well trying to bury the nonabsorbable sutures.    We irrigated the wound copiously before placing local antibiotic as listed above.  We closed the incision in a multilayer fashion with absorbable suture.  Sterile dressing was placed.  Patient was awoken taken to PACU in stable condition.  Alfonse Alpers, PA-C, present and scrubbed throughout the case, critical for completion in a timely fashion, and for retraction, instrumentation, closure.

## 2020-08-04 ENCOUNTER — Encounter (HOSPITAL_BASED_OUTPATIENT_CLINIC_OR_DEPARTMENT_OTHER): Payer: Self-pay | Admitting: Orthopaedic Surgery

## 2021-10-15 IMAGING — DX DG KNEE 1-2V*L*
2 series · 2 of 2 positions shown · non-contrast
Comparison: None.

CLINICAL DATA: Injury today.  Pain.

EXAM:
LEFT KNEE - 1-2 VIEW

[knee ap]
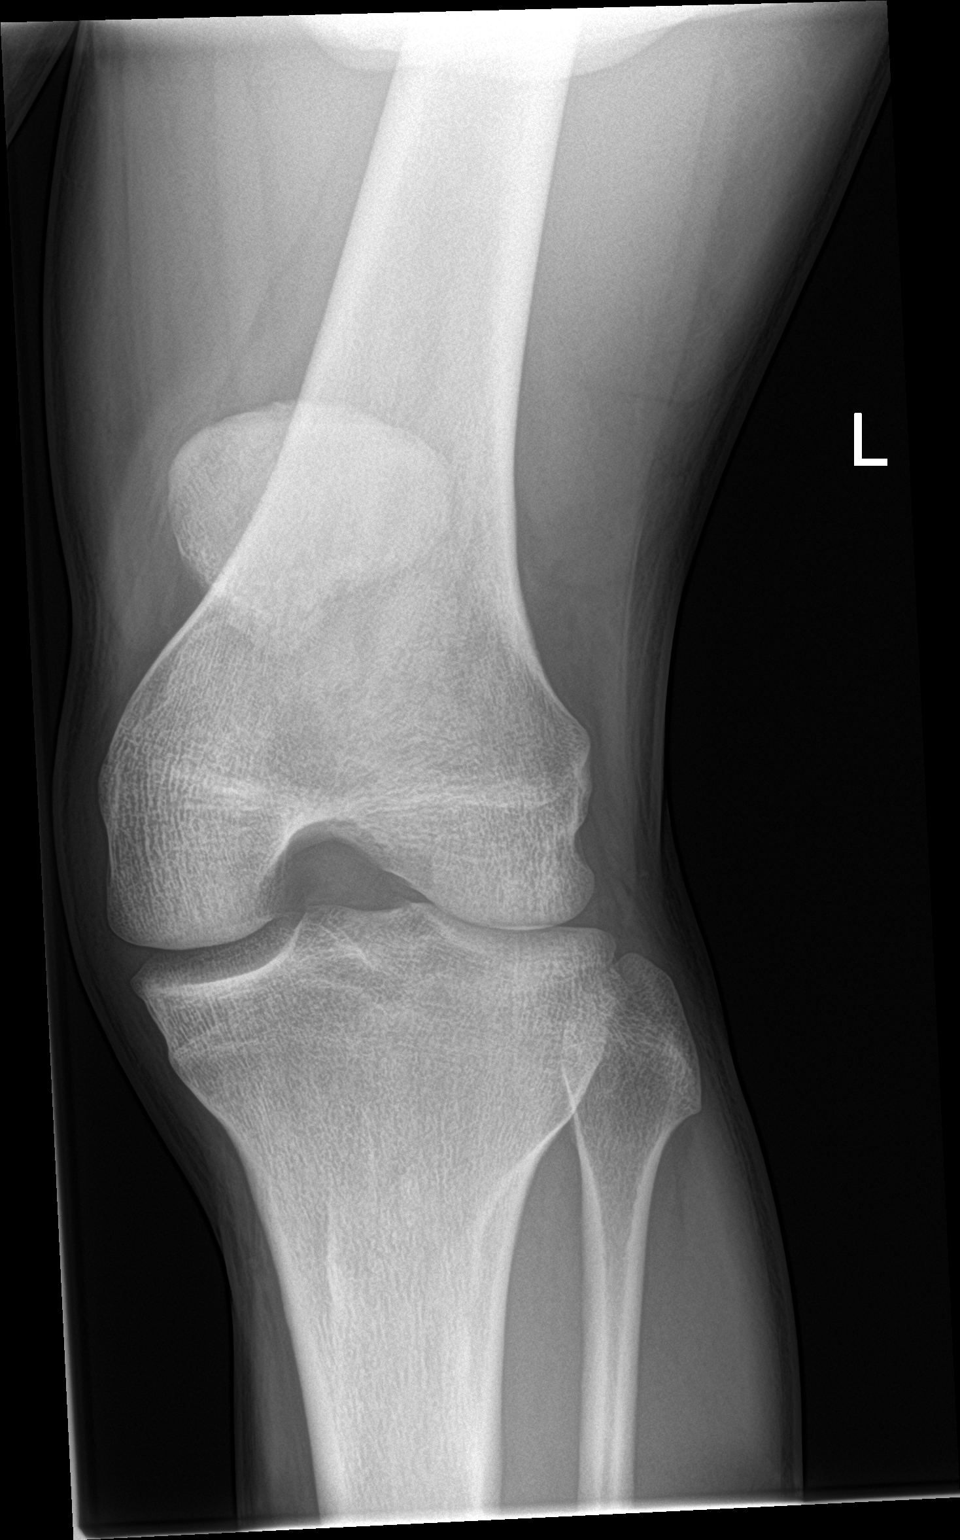

[knee lat]
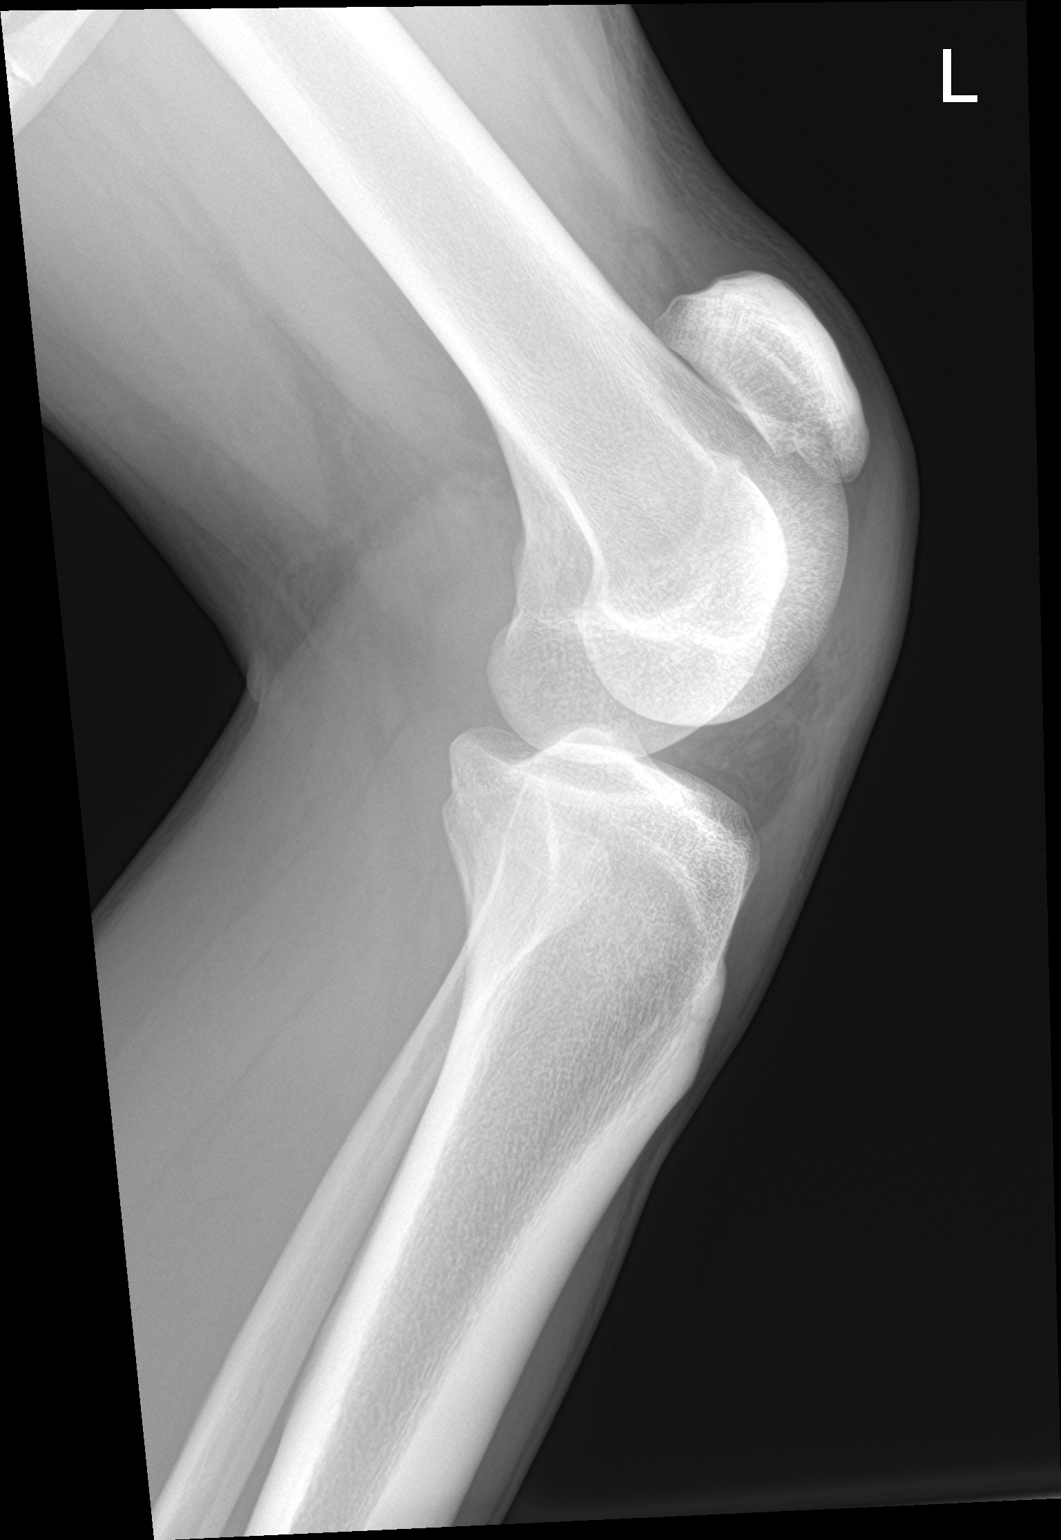

[2 of 2 positions shown; findings below may reference images not displayed]

FINDINGS: No acute fracture or dislocation. the patella is high riding,
suggesting patellar tendon disruption. No knee joint effusion.
IMPRESSION: High riding patella suggests patellar tendon disruption. This could
alternatively be positional as the technologist states the patient
is unable to be placed in the in the routine position.
